# Patient Record
Sex: Male | Born: 1980 | Race: White | Hispanic: No | Marital: Single | Smoking: Current every day smoker
Health system: Southern US, Community
[De-identification: ages and names within clinical notes are randomized; demographics above are authoritative.]

---
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
## 2001-04-13 ENCOUNTER — Encounter: Payer: Self-pay | Admitting: Family Medicine

## 2001-04-13 ENCOUNTER — Ambulatory Visit (HOSPITAL_COMMUNITY): Admission: RE | Admit: 2001-04-13 | Discharge: 2001-04-13 | Payer: Self-pay | Admitting: Family Medicine

## 2002-07-14 ENCOUNTER — Ambulatory Visit (HOSPITAL_COMMUNITY): Admission: RE | Admit: 2002-07-14 | Discharge: 2002-07-14 | Payer: Self-pay | Admitting: Family Medicine

## 2002-07-14 ENCOUNTER — Encounter: Payer: Self-pay | Admitting: Family Medicine

## 2003-07-05 ENCOUNTER — Ambulatory Visit (HOSPITAL_COMMUNITY): Admission: RE | Admit: 2003-07-05 | Discharge: 2003-07-05 | Payer: Self-pay | Admitting: Family Medicine

## 2009-07-03 ENCOUNTER — Emergency Department (HOSPITAL_COMMUNITY): Admission: EM | Admit: 2009-07-03 | Discharge: 2009-07-03 | Payer: Self-pay | Admitting: Emergency Medicine

## 2010-01-21 ENCOUNTER — Ambulatory Visit: Payer: Self-pay | Admitting: Diagnostic Radiology

## 2010-01-21 ENCOUNTER — Emergency Department (HOSPITAL_BASED_OUTPATIENT_CLINIC_OR_DEPARTMENT_OTHER): Admission: EM | Admit: 2010-01-21 | Discharge: 2010-01-21 | Payer: Self-pay | Admitting: Emergency Medicine

## 2010-07-23 LAB — COMPREHENSIVE METABOLIC PANEL
ALT: 31 U/L (ref 0–53)
AST: 27 U/L (ref 0–37)
Alkaline Phosphatase: 64 U/L (ref 39–117)
CO2: 25 mEq/L (ref 19–32)
Chloride: 106 mEq/L (ref 96–112)
Creatinine, Ser: 0.85 mg/dL (ref 0.4–1.5)
GFR calc Af Amer: >60 mL/min (ref >60)
GFR calc non Af Amer: >60 mL/min (ref >60)
Potassium: 3.8 mEq/L (ref 3.5–5.1)
Total Bilirubin: 0.6 mg/dL (ref 0.3–1.2)

## 2010-07-23 LAB — DIFFERENTIAL
Basophils Absolute: 0.2 10*3/uL — ABNORMAL HIGH (ref 0.0–0.1)
Basophils Relative: 2 % — ABNORMAL HIGH (ref 0–1)
Eosinophils Absolute: 0.4 10*3/uL (ref 0.0–0.7)
Eosinophils Relative: 3 % (ref 0–5)
Lymphocytes Relative: 19 % (ref 12–46)

## 2010-07-23 LAB — CBC
MCV: 86.6 fL (ref 78.0–100.0)
RBC: 5.4 MIL/uL (ref 4.22–5.81)
WBC: 13.5 10*3/uL — ABNORMAL HIGH (ref 4.0–10.5)

## 2010-07-23 LAB — LIPASE, BLOOD: Lipase: 23 U/L (ref 11–59)

## 2014-03-02 ENCOUNTER — Encounter (HOSPITAL_BASED_OUTPATIENT_CLINIC_OR_DEPARTMENT_OTHER): Payer: Self-pay | Admitting: Emergency Medicine

## 2014-03-02 ENCOUNTER — Emergency Department (HOSPITAL_BASED_OUTPATIENT_CLINIC_OR_DEPARTMENT_OTHER)
Admission: EM | Admit: 2014-03-02 | Discharge: 2014-03-02 | Disposition: A | Discharge status: Home / Self Care | Payer: Self-pay | Attending: Emergency Medicine | Admitting: Emergency Medicine

## 2014-03-02 DIAGNOSIS — Y9289 Other specified places as the place of occurrence of the external cause: Secondary | ICD-10-CM | POA: Insufficient documentation

## 2014-03-02 DIAGNOSIS — W57XXXA Bitten or stung by nonvenomous insect and other nonvenomous arthropods, initial encounter: Secondary | ICD-10-CM | POA: Insufficient documentation

## 2014-03-02 DIAGNOSIS — Y9389 Activity, other specified: Secondary | ICD-10-CM | POA: Insufficient documentation

## 2014-03-02 DIAGNOSIS — L03114 Cellulitis of left upper limb: Secondary | ICD-10-CM | POA: Insufficient documentation

## 2014-03-02 DIAGNOSIS — Z720 Tobacco use: Secondary | ICD-10-CM | POA: Insufficient documentation

## 2014-03-02 DIAGNOSIS — S61432A Puncture wound without foreign body of left hand, initial encounter: Secondary | ICD-10-CM | POA: Insufficient documentation

## 2014-03-02 MED ORDER — IBUPROFEN 800 MG PO TABS: 800.0000 mg | ORAL_TABLET | Freq: Three times a day (TID) | ORAL | Status: AC

## 2014-03-02 MED ORDER — CEPHALEXIN 250 MG PO CAPS
1000.0000 mg | ORAL_CAPSULE | Freq: Once | ORAL | Status: AC
  Administered 2014-03-02: 1000 mg via ORAL
  Filled 2014-03-02: qty 4

## 2014-03-02 MED ORDER — CEPHALEXIN 250 MG PO CAPS
ORAL_CAPSULE | ORAL | Status: AC
  Filled 2014-03-02: qty 3

## 2014-03-02 MED ORDER — OXYCODONE-ACETAMINOPHEN 5-325 MG PO TABS
2.0000 | ORAL_TABLET | Freq: Once | ORAL | Status: AC
  Administered 2014-03-02: 2 via ORAL
  Filled 2014-03-02: qty 2

## 2014-03-02 MED ORDER — OXYCODONE-ACETAMINOPHEN 5-325 MG PO TABS
1.0000 | ORAL_TABLET | Freq: Four times a day (QID) | ORAL | Status: AC | PRN
Start: 2014-03-02 — End: ?

## 2014-03-02 MED ORDER — CEPHALEXIN 500 MG PO CAPS: 500.0000 mg | ORAL_CAPSULE | Freq: Four times a day (QID) | ORAL | Status: AC

## 2014-03-02 MED ORDER — SULFAMETHOXAZOLE-TRIMETHOPRIM 800-160 MG PO TABS
1.0000 | ORAL_TABLET | Freq: Once | ORAL | Status: AC
  Administered 2014-03-02: 1 via ORAL
  Filled 2014-03-02: qty 1

## 2014-03-02 MED ORDER — SULFAMETHOXAZOLE-TRIMETHOPRIM 800-160 MG PO TABS: 1.0000 | ORAL_TABLET | Freq: Two times a day (BID) | ORAL | Status: AC
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
## 2014-03-02 NOTE — Discharge Instructions (Signed)
Take keflex, bactrim for a week.   Take motrin for pain.   Take percocet for severe pain.   Follow up in 2 days for recheck.   Return to ER if you have severe pain, fever.

## 2014-03-02 NOTE — ED Notes (Signed)
Left arm red swollen and painful x4 days  No know inj

## 2014-10-10 ENCOUNTER — Emergency Department (HOSPITAL_BASED_OUTPATIENT_CLINIC_OR_DEPARTMENT_OTHER): Payer: Self-pay

## 2014-10-10 ENCOUNTER — Emergency Department (HOSPITAL_BASED_OUTPATIENT_CLINIC_OR_DEPARTMENT_OTHER)
Admission: EM | Admit: 2014-10-10 | Discharge: 2014-10-11 | Disposition: A | Discharge status: Home / Self Care | Payer: Self-pay | Attending: Emergency Medicine | Admitting: Emergency Medicine

## 2014-10-10 ENCOUNTER — Emergency Department (HOSPITAL_COMMUNITY): Payer: Self-pay

## 2014-10-10 ENCOUNTER — Encounter (HOSPITAL_BASED_OUTPATIENT_CLINIC_OR_DEPARTMENT_OTHER): Payer: Self-pay | Admitting: Emergency Medicine

## 2014-10-10 DIAGNOSIS — H538 Other visual disturbances: Secondary | ICD-10-CM

## 2014-10-10 DIAGNOSIS — H4922 Sixth [abducent] nerve palsy, left eye: Secondary | ICD-10-CM

## 2014-10-10 DIAGNOSIS — H532 Diplopia: Secondary | ICD-10-CM | POA: Insufficient documentation

## 2014-10-10 DIAGNOSIS — H4920 Sixth [abducent] nerve palsy, unspecified eye: Secondary | ICD-10-CM | POA: Insufficient documentation

## 2014-10-10 LAB — CBC WITH DIFFERENTIAL/PLATELET
Basophils Absolute: 0 10*3/uL (ref 0.0–0.1)
Basophils Relative: 0 % (ref 0–1)
Eosinophils Absolute: 0.3 10*3/uL (ref 0.0–0.7)
Eosinophils Relative: 3 % (ref 0–5)
HCT: 49.5 % (ref 39.0–52.0)
Hemoglobin: 17 g/dL (ref 13.0–17.0)
Lymphocytes Relative: 29 % (ref 12–46)
Lymphs Abs: 2.5 10*3/uL (ref 0.7–4.0)
MCH: 29 pg (ref 26.0–34.0)
MCHC: 34.3 g/dL (ref 30.0–36.0)
MCV: 84.5 fL (ref 78.0–100.0)
Monocytes Absolute: 0.7 10*3/uL (ref 0.1–1.0)
Monocytes Relative: 8 % (ref 3–12)
Neutro Abs: 5.1 10*3/uL (ref 1.7–7.7)
Neutrophils Relative %: 60 % (ref 43–77)
Platelets: 239 10*3/uL (ref 150–400)
RBC: 5.86 MIL/uL — ABNORMAL HIGH (ref 4.22–5.81)
RDW: 14.2 % (ref 11.5–15.5)
WBC: 8.7 10*3/uL (ref 4.0–10.5)

## 2014-10-10 LAB — BASIC METABOLIC PANEL
Anion gap: 6 (ref 5–15)
BUN: 11 mg/dL (ref 6–20)
CO2: 26 mmol/L (ref 22–32)
Calcium: 8.9 mg/dL (ref 8.9–10.3)
Chloride: 110 mmol/L (ref 101–111)
Creatinine, Ser: 0.91 mg/dL (ref 0.61–1.24)
GFR calc Af Amer: >60 mL/min (ref >60)
GFR calc non Af Amer: >60 mL/min (ref >60)
Glucose, Bld: 100 mg/dL — ABNORMAL HIGH (ref 65–99)
Potassium: 3.9 mmol/L (ref 3.5–5.1)
Sodium: 142 mmol/L (ref 135–145)

## 2014-10-10 MED ORDER — GADOBENATE DIMEGLUMINE 529 MG/ML IV SOLN
20.0000 mL | Freq: Once | INTRAVENOUS | Status: AC | PRN
  Administered 2014-10-10: 20 mL via INTRAVENOUS
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
## 2014-10-10 NOTE — ED Notes (Signed)
Family at bedside. Pt still in MRI.

## 2014-10-10 NOTE — ED Notes (Addendum)
Patient states that he is having double vision only when both eyes are open. He reports that this has been happening x 3 days. When he closes one eye or the other he does not have double vision. Patient denies any pain or any other symptoms

## 2014-10-10 NOTE — ED Notes (Signed)
Report given to [place], Consulting civil engineer at [facility] ED

## 2014-10-10 NOTE — ED Notes (Signed)
MD at bedside. 

## 2014-10-10 NOTE — ED Provider Notes (Signed)
CSN: [id number]     Arrival date & time 10/10/14  1514 History   First MD Initiated Contact with Patient 10/10/14 1520     Chief Complaint  Patient presents with  . Blurred Vision    HPI   67 male presents with double vision today. Patient reports that he recently started noticing double vision over the last 3 days. He states the double vision has been consistent, it is not present when he covers one eye, and is better when looking to his right upper field division, better with vision 2-3 feet away, worse at far distance Patient denies headache, photophobia, painful occular movements, nausea, vomiting, ear pain, facial pain, focal neurological deficits, over the head, significant past medical history, or any other concerning signs or symptoms.no recent infections, Patient has no other complaints at today's visit.    History reviewed. No pertinent past medical history. History reviewed. No pertinent past surgical history. History reviewed. No pertinent family history. History  Substance Use Topics  . Smoking status: Current Every Day Smoker  . Smokeless tobacco: Not on file  . Alcohol Use: No    Review of Systems  All other systems reviewed and are negative.   Allergies  Review of patient's allergies indicates no known allergies.  Home Medications   Prior to Admission medications   Medication Sig Start Date End Date Taking? Authorizing Provider  cephALEXin (KEFLEX) 500 MG capsule Take 1 capsule (500 mg total) by mouth 4 (four) times daily. 03/02/14   Richardean Canal, MD  ibuprofen (ADVIL,MOTRIN) 800 MG tablet Take 1 tablet (800 mg total) by mouth 3 (three) times daily. 03/02/14   Richardean Canal, MD  oxyCODONE-acetaminophen (PERCOCET) 5-325 MG per tablet Take 1-2 tablets by mouth every 6 (six) hours as needed. 03/02/14   Richardean Canal, MD  sulfamethoxazole-trimethoprim (SEPTRA DS) 800-160 MG per tablet Take 1 tablet by mouth every 12 (twelve) hours. 03/02/14   Richardean Canal, MD   BP 122/73 mmHg   Pulse 78  Temp(Src) 98.6 F (37 C) (Oral)  Resp 18  Ht 6\' 3"  (1.905 m)  Wt 250 lb (113.399 kg)  BMI 31.25 kg/m2  SpO2 99%   Visual acuity:  Distant 20/30 Right dist: 20/40 Left dist: 20/40  Physical Exam  Constitutional: He is oriented to person, place, and time. He appears well-developed and well-nourished.  HENT:  Head: Normocephalic and atraumatic.  Right Ear: Hearing and tympanic membrane normal.  Left Ear: Hearing and tympanic membrane normal.  Eyes: Conjunctivae are normal. Pupils are equal, round, and reactive to light. Right eye exhibits no discharge. Left eye exhibits no discharge. No scleral icterus. Right eye exhibits abnormal extraocular motion.  Double vision in all fields with the exception of the right upper, worse with left-sided vision and far vision. Patient tracks in all directions without difficulty, bringing finger close to nose patient's left eye falls inward and is unable to track back out. He was her equal round and reactive to light they're enlarged at 8 mm, red reflex present. Vision nonpainful, no photophobia, or painful ocular movements and no signs of infection, conjunctiva normal limits, sclerae are normal, no ptosis. Left eye slightly misaligned with medial deviation.  Neck: Normal range of motion. Neck supple. No JVD present. No tracheal deviation present. No thyromegaly present.  Cardiovascular: Regular rhythm and normal heart sounds.  Exam reveals no gallop and no friction rub.   No murmur heard. Pulmonary/Chest: Effort normal. No stridor.  Musculoskeletal:  Strength 5 out  of 5 in all muscle groups  Lymphadenopathy:    He has no cervical adenopathy.  Neurological: He is alert and oriented to person, place, and time. He has normal strength and normal reflexes. No cranial nerve deficit or sensory deficit. He displays a negative Romberg sign. Coordination and gait normal. GCS eye subscore is 4. GCS verbal subscore is 5. GCS motor subscore is 6.  Reflex  Scores:      Patellar reflexes are 2+ on the right side and 2+ on the left side. Cranial nerves intact with the exception findings noted.   Psychiatric: He has a normal mood and affect. His behavior is normal. Judgment and thought content normal.  Nursing note and vitals reviewed.    ED Course  Procedures (including critical care time) Labs Review Labs Reviewed  CBC WITH DIFFERENTIAL/PLATELET - Abnormal; Notable for the following:    RBC 5.86 (*)    All other components within normal limits  BASIC METABOLIC PANEL - Abnormal; Notable for the following:    Glucose, Bld 100 (*)    All other components within normal limits    Imaging Review Ct Head Wo Contrast  10/10/2014   CLINICAL DATA:  Blurry vision for 4 days.  No known injury.  EXAM: CT HEAD WITHOUT CONTRAST  TECHNIQUE: Contiguous axial images were obtained from the base of the skull through the vertex without intravenous contrast.  COMPARISON:  None.  FINDINGS: The brain appears normal without hemorrhage, infarct, mass lesion, mass effect, midline shift or abnormal extra-axial fluid collection. No hydrocephalus or pneumocephalus. The calvarium is intact.  IMPRESSION: Negative head CT.   Electronically Signed   By: Drusilla Kanner M.D.   On: 10/10/2014 16:08     EKG Interpretation None      MDM   Final diagnoses:  Double vision    Labs: CBC, BMP  Imaging: CT Head- normal  Ped[place]_ MRI with and without, MRA head  Consults: Dr. Amada Jupiter Neurology   Therapeutics: None  Assessment:  Double vision  Plan: Patient presented with double vision for the 3 days. Patient has no associated symptoms, completely normal neuro exam with the exception of slight misalignment of the left eye medial deviation, and loss of tracking with near vision in the medial field. Patient has no other complaints today, no other significant findings on exam. CT head was negative. I consult that Dr. Amada Jupiter with neurology who suggested MRI with  and without, MRA head for further evaluation of this patient. Case was discussed with Dr. Judd Lien who contacted [facility] ED and spoke with Dr. Preston Fleeting who agreed to accept the patient. Patient reports that his mother lives close to [facility] and agreed to take him to [place], for further evaluation and management. Patient was instructed on importance of follow-up evaluation, and potential life-threatening consequences of not following up. He reports that he will follow-up [facility] with all necessary test.     Eyvonne Mechanic, PA-C 10/10/14 1744  Eyvonne Mechanic, PA-C 10/10/14 1745  Geoffery Lyons, MD 10/11/14 1316

## 2014-10-10 NOTE — ED Notes (Signed)
Pt still in MRI 

## 2014-10-10 NOTE — ED Provider Notes (Signed)
34 year old male is in with double vision for the last 4 days. This is most noticeable with left lateral gaze. Symptoms of been stable. On exam, he does have some weakness of the left lateral rectus muscle suggesting a partial abducens nerve palsy. He was transferred [facility] for MRI scan which has been ordered.  MRI is normal. Patient is advised of the results. He is referred to neurology for further evaluation.  Dione Booze, MD 10/11/14 Moses Manners

## 2014-10-11 NOTE — Discharge Instructions (Signed)
Your MRI scan did not show any evidence of stroke or tumor. The double vision you have is because of a problem with a nerve going into the muscle that pulls your left thigh to the left. This may get better with time. Please make an appointment with the neurologist for further evaluation. In the meantime, if the double vision is bothering you, try patching one eye.  Diplopia  Double vision (diplopia) means that you are seeing two of everything. Diplopia usually occurs with both eyes open, and may be worse when looking in one particular direction. If both eyes must be open to see double, this is called binocular diplopia. If double images are seen in just one eye, this is called monocular diplopia.  CAUSES  Binocular Diplopia  Disorder affecting the muscles that move the eyes or the nerves that control those muscles.  Tumor or other mass pushing on an eye from beside or behind the eye.  Myasthenia gravis. This is a neuromuscular illness that causes the body's muscles to tire easily. The eye muscles and eyelid muscles become weak. The eyes do not track together well.  Grave's disease. This is an overactivity of the thyroid gland. This condition causes swelling of tissues around the eyes. This produces a bulging out of the eyeball.  Blowout fracture of the bone around the eye. The muscles of the eye socket are damaged. This often happens when the eye is hit with force.  Complications after certain surgeries, such as a lens implant after cataract surgery.  Fluid-filled mass (abscess) behind or beside the eye, infection, or abnormal connection between arteries and veins. Sometimes, no cause is found. Monocular Diplopia  Problems with corrective lens or contacts.  Corneal abrasion, infection, severe injury, or bulging and irregularity of the corneal surface (keratoconus).  Irregularities of the pupil from drugs, severe injury, or other causes.  Problems involving the lens of the eye, such as  opacities or cataracts.  Complications after certain surgeries, such as a lens implant after cataract surgery.  Retinal detachment or problems involving the blood vessels of the retina. Sometimes, no cause is found. SYMPTOMS  Binocular Diplopia  When one eye is closed or covered, the double images disappear. Monocular Diplopia  When the unaffected eye is closed or covered, the double images remain. The double images disappear when the affected eye is closed or covered. DIAGNOSIS  A diagnosis is made during an eye exam. TREATMENT  Treatment depends on the cause or underlying disease.   Relief of double vision symptoms may be achieved by patching one eye or by using special glasses.  Surgery on the muscles of the eye may be needed. SEEK IMMEDIATE MEDICAL CARE IF:   You see two images of a single object you are looking at, either with both eyes open or with just one eye open. Document Released: 02/15/2004 Document Revised: 07/08/2011 Document Reviewed: 12/02/2007 [facility] Patient Information 2015 [place], [place]. This information is not intended to replace advice given to you by your health care provider. Make sure you discuss any questions you have with your health care provider.

## 2015-04-22 ENCOUNTER — Emergency Department (HOSPITAL_COMMUNITY)
Admission: EM | Admit: 2015-04-22 | Discharge: 2015-04-30 | Disposition: E | Payer: Self-pay | Attending: Emergency Medicine | Admitting: Emergency Medicine

## 2015-04-22 DIAGNOSIS — Y998 Other external cause status: Secondary | ICD-10-CM | POA: Insufficient documentation

## 2015-04-22 DIAGNOSIS — S21102A Unspecified open wound of left front wall of thorax without penetration into thoracic cavity, initial encounter: Secondary | ICD-10-CM | POA: Insufficient documentation

## 2015-04-22 DIAGNOSIS — S21112A Laceration without foreign body of left front wall of thorax without penetration into thoracic cavity, initial encounter: Secondary | ICD-10-CM

## 2015-04-22 DIAGNOSIS — W260XXA Contact with knife, initial encounter: Secondary | ICD-10-CM | POA: Insufficient documentation

## 2015-04-22 DIAGNOSIS — Y9289 Other specified places as the place of occurrence of the external cause: Secondary | ICD-10-CM | POA: Insufficient documentation

## 2015-04-22 DIAGNOSIS — Y9389 Activity, other specified: Secondary | ICD-10-CM | POA: Insufficient documentation
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
## 2015-04-22 NOTE — ED Notes (Signed)
CPR stopped for pulse check. No pulse identified. CPR restarted.

## 2015-04-22 NOTE — ED Notes (Signed)
Time of death 411945, calle dby Silverio LayYao, MD.

## 2015-04-22 NOTE — Progress Notes (Signed)
Patient came in with CPR in progress. RT assisted ED physician with intubation. ETCO2 positive color change. Bilateral breath sounds present. Patient expired before CXR.

## 2015-04-22 NOTE — ED Notes (Signed)
Pt transported to morgue 
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
## 2015-04-22 NOTE — ED Notes (Signed)
Pt intubated by Katrinka BlazingSmith, MD with a 7.5 ET, measuring 25cm at the teeth. Bilateral breath sounds heard.
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
## 2015-04-25 ENCOUNTER — Telehealth: Payer: Self-pay | Admitting: Lab

## 2015-04-27 NOTE — Telephone Encounter (Signed)
Pt is deceased-informed PCP's contact Jason.  He was not aware but said he would follow up

## 2015-04-30 DEATH — deceased

## 2016-05-22 IMAGING — MR MR MRA HEAD W/O CM
10 of 13 series · 30 of 48 positions shown · IV contrast (Yes   MH)
Comparison: Prior CT from earlier the same day.

ADDENDUM:
Indication for this exam was not included on initial report. This
study was performed for 1 day history of acute double vision/blurry
vision.

EXAM:
MRI HEAD WITHOUT AND WITH CONTRAST
MRA HEAD WITH CONTRAST
TECHNIQUE: Multiplanar, multiecho pulse sequences of the brain and surrounding
structures were obtained without and with intravenous contrast.
Angiographic images of the head were obtained using MRA technique
without and with contrast.
CONTRAST:  20mL MULTIHANCE GADOBENATE DIMEGLUMINE 529 MG/ML IV SOLN

[Series 3: (id) mt fs · axial · 1.4mm · 0.43mm/px · z∈[-64,-38]mm · 3 of 136 slices shown]
[im 1/136]
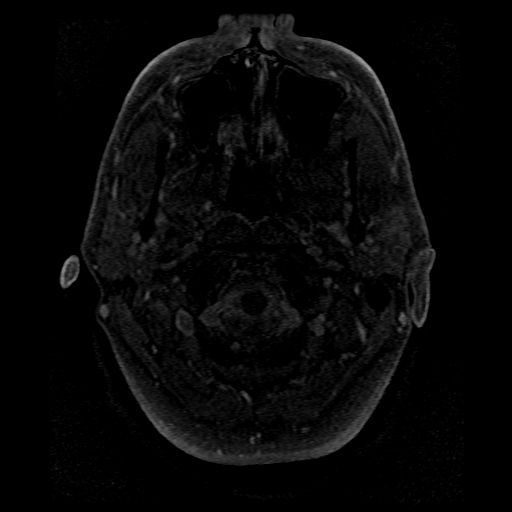
[im 20/136]
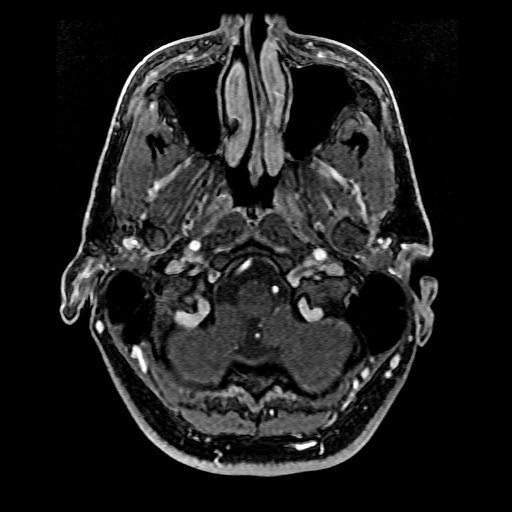
[im 39/136]
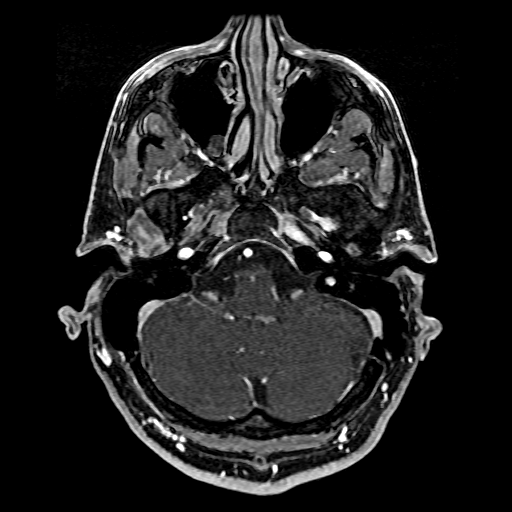

[Series 3: T1 · sagittal · 5.0mm · 0.47mm/px · 2 of 23 slices shown]
[im 1/23]
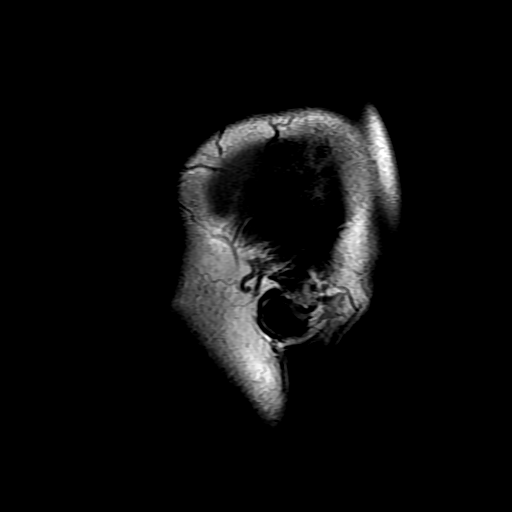
[im 23/23]
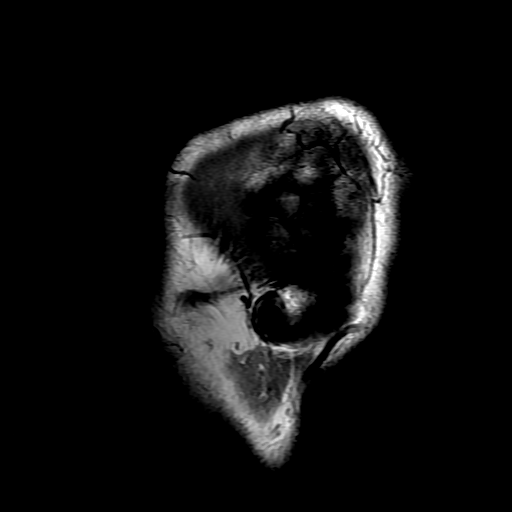

[Series 4: DWI · axial · 3.0mm · 1.09mm/px · z∈[-67,+71]mm · 7 of 94 slices shown (1 of 4)]
[im 1/94]
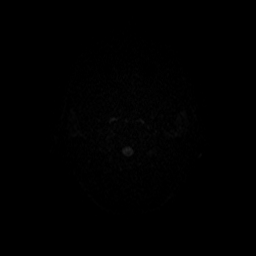
[im 16/94]
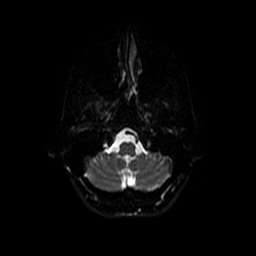
[im 32/94]
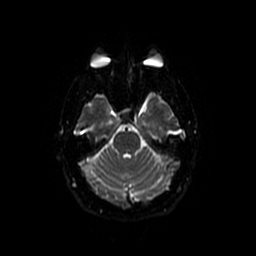
[im 47/94]
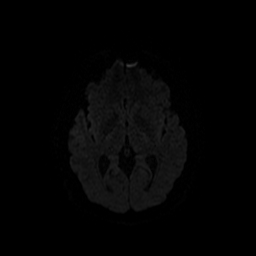
[im 63/94]
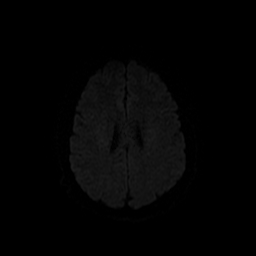
[im 78/94]
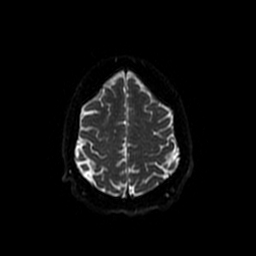
[im 94/94]
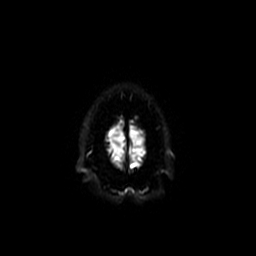

[Series 5: DWI · coronal · 5.0mm · 1.09mm/px · 5 of 66 slices shown (2 of 4)]
[im 1/66]
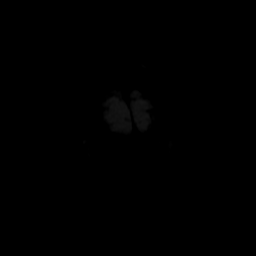
[im 17/66]
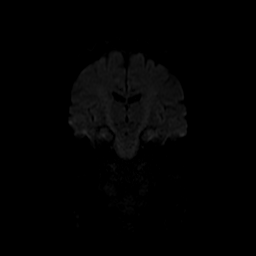
[im 33/66]
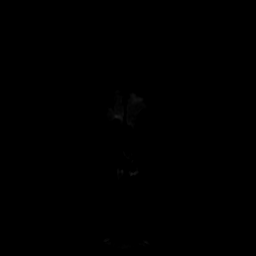
[im 49/66]
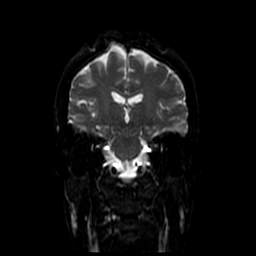
[im 66/66]
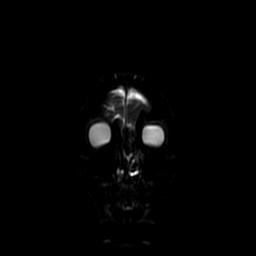

[Series 6: T2 · axial · 5.0mm · 0.47mm/px · z∈[-78,+72]mm · 2 of 26 slices shown (1 of 2)]
[im 1/26]
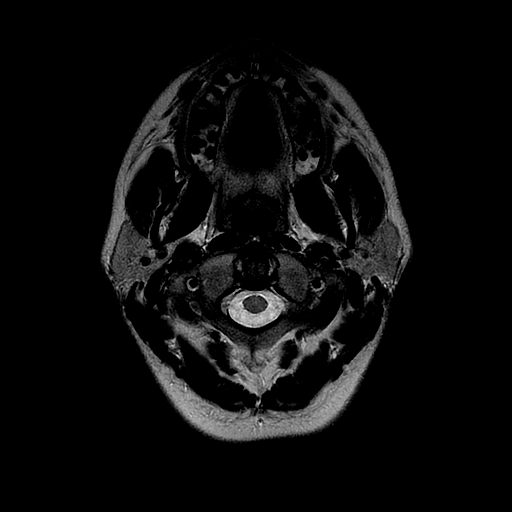
[im 26/26]
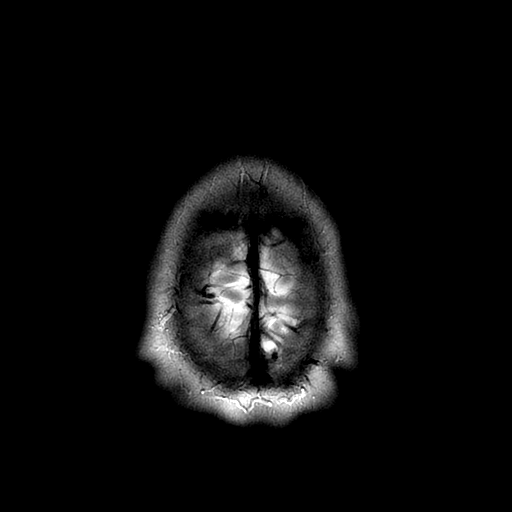

[Series 7: FLAIR · axial · 5.0mm · 0.47mm/px · z∈[-78,+72]mm · 2 of 26 slices shown]
[im 1/26]
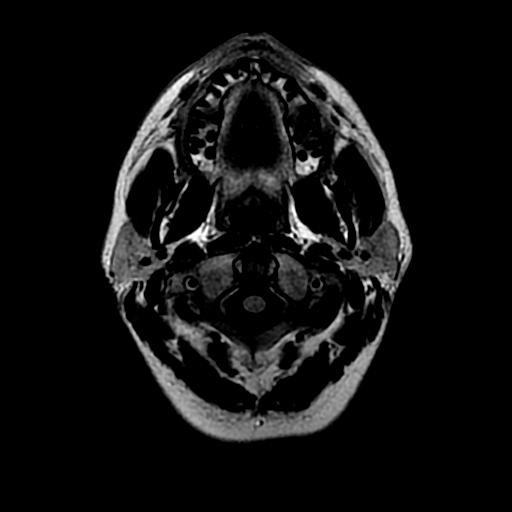
[im 26/26]
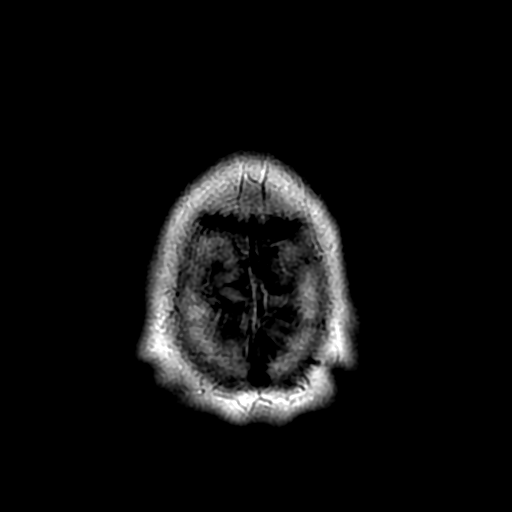

[Series 11: T2 · coronal · 5.0mm · 0.43mm/px · 2 of 28 slices shown (2 of 2)]
[im 1/28]
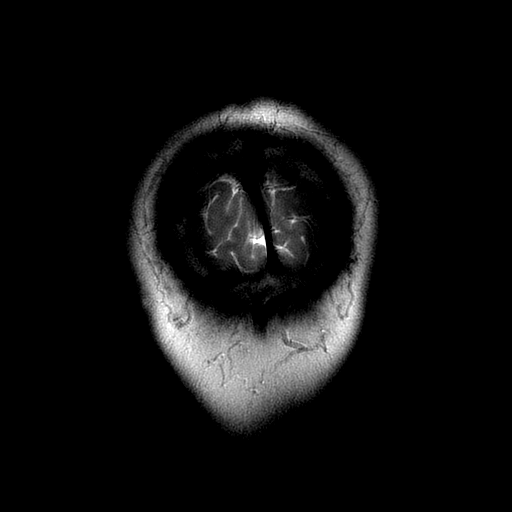
[im 28/28]
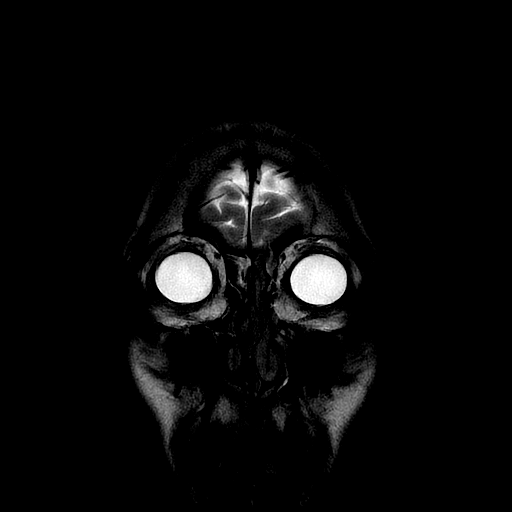

[Series 12: T1 post-contrast · coronal · 5.0mm · 0.43mm/px · 2 of 28 slices shown]
[im 1/28]
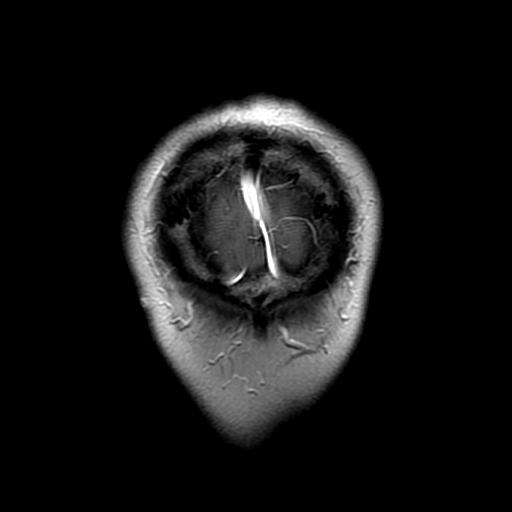
[im 28/28]
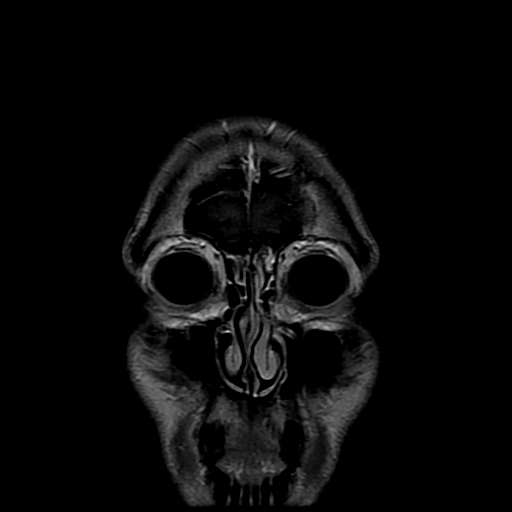

[Series 400: DWI · axial · 3.0mm · 1.09mm/px · z∈[-67,+71]mm · 3 of 47 slices shown (3 of 4)]
[im 1/47]
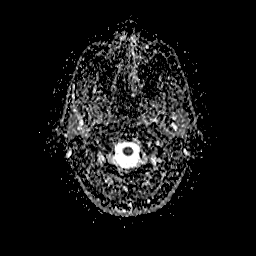
[im 24/47]
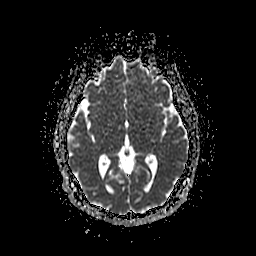
[im 47/47]
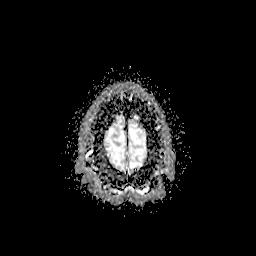

[Series 500: DWI · coronal · 5.0mm · 1.09mm/px · 2 of 33 slices shown (4 of 4)]
[im 1/33]
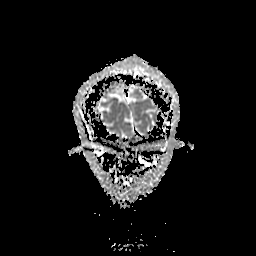
[im 33/33]
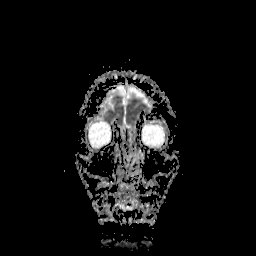

[30 of 48 positions shown; findings below may reference images not displayed]

FINDINGS: MRI HEAD FINDINGS

The CSF containing spaces are within normal limits for patient age.
No focal parenchymal signal abnormality is identified. No mass
lesion, midline shift, or extra-axial fluid collection. Ventricles
are normal in size without evidence of hydrocephalus.

No diffusion-weighted signal abnormality is identified to suggest
acute intracranial infarct. Gray-white matter differentiation is
maintained. Normal flow voids are seen within the intracranial
vasculature. No intracranial hemorrhage identified.

The cervicomedullary junction is normal. Pituitary gland is within
normal limits. Pituitary stalk is midline. The globes and optic
nerves demonstrate a normal appearance with normal signal intensity.

No abnormal enhancement.

The bone marrow signal intensity is normal. Calvarium is intact.
Visualized upper cervical spine is within normal limits.

Scalp soft tissues are unremarkable.

Paranasal sinuses are clear. Small retention cyst noted within the
right maxillary sinus. No mastoid effusion.

MRA HEAD FINDINGS

ANTERIOR CIRCULATION:

Study somewhat limited due to post-contrast nature of the exam.

The visualized portions of the distal cervical internal carotid
arteries are widely patent with antegrade flow. The petrous,
cavernous, and supra clinoid segments are symmetric in caliber
bilaterally. A1 segments, anterior communicating artery, and
anterior cerebral arteries are widely patent. Middle cerebral
arteries are widely patent with antegrade flow without high-grade
flow-limiting stenosis or proximal branch occlusion. Distal MCA
branches are symmetric bilaterally. No intracranial aneurysm within
the anterior circulation.

POSTERIOR CIRCULATION:

The vertebral arteries are widely patent with antegrade flow. The
posterior inferior cerebral arteries are normal. Vertebrobasilar
junction and basilar artery are widely patent with antegrade flow
without evidence of basilar tip stenosis or aneurysm. Posterior
cerebral arteries are normal bilaterally. The superior cerebellar
arteries and anterior inferior cerebellar arteries are widely patent
bilaterally. No intracranial aneurysm within the posterior
circulation.
IMPRESSION: Normal MRI/MRA of the brain.
